# Patient Record
Sex: Male | Born: 1985 | Race: White | Hispanic: No | Marital: Single | Smoking: Current every day smoker
Health system: Southern US, Community
[De-identification: ages and names within clinical notes are randomized; demographics above are authoritative.]

## PROBLEM LIST (undated history)

## (undated) DIAGNOSIS — K4090 Unilateral inguinal hernia, without obstruction or gangrene, not specified as recurrent: Secondary | ICD-10-CM

---

## 2005-02-06 ENCOUNTER — Emergency Department (HOSPITAL_COMMUNITY): Admission: EM | Admit: 2005-02-06 | Discharge: 2005-02-06 | Payer: Self-pay | Admitting: Emergency Medicine

## 2005-06-23 ENCOUNTER — Emergency Department (HOSPITAL_COMMUNITY): Admission: EM | Admit: 2005-06-23 | Discharge: 2005-06-23 | Payer: Self-pay | Admitting: Emergency Medicine

## 2005-10-31 ENCOUNTER — Emergency Department (HOSPITAL_COMMUNITY): Admission: EM | Admit: 2005-10-31 | Discharge: 2005-10-31 | Payer: Self-pay | Admitting: Family Medicine

## 2006-04-10 ENCOUNTER — Emergency Department (HOSPITAL_COMMUNITY): Admission: EM | Admit: 2006-04-10 | Discharge: 2006-04-10 | Payer: Self-pay | Admitting: Emergency Medicine

## 2009-12-10 ENCOUNTER — Emergency Department (HOSPITAL_COMMUNITY)
Admission: EM | Admit: 2009-12-10 | Discharge: 2009-12-10 | Payer: Self-pay | Source: Home / Self Care | Admitting: Emergency Medicine

## 2011-07-22 ENCOUNTER — Encounter (HOSPITAL_COMMUNITY): Payer: Self-pay

## 2011-07-22 ENCOUNTER — Emergency Department (INDEPENDENT_AMBULATORY_CARE_PROVIDER_SITE_OTHER)
Admission: EM | Admit: 2011-07-22 | Discharge: 2011-07-22 | Disposition: A | Discharge status: Home / Self Care | Payer: Self-pay | Source: Home / Self Care | Attending: Family Medicine | Admitting: Family Medicine

## 2011-07-22 DIAGNOSIS — K089 Disorder of teeth and supporting structures, unspecified: Secondary | ICD-10-CM

## 2011-07-22 DIAGNOSIS — K0889 Other specified disorders of teeth and supporting structures: Secondary | ICD-10-CM

## 2011-07-22 MED ORDER — DICLOFENAC POTASSIUM 50 MG PO TABS: 50.0000 mg | ORAL_TABLET | Freq: Three times a day (TID) | ORAL | Status: AC

## 2011-07-22 MED ORDER — AMOXICILLIN 500 MG PO CAPS: 500.0000 mg | ORAL_CAPSULE | Freq: Three times a day (TID) | ORAL | Status: AC

## 2011-07-22 NOTE — Discharge Instructions (Signed)
Call dentist on mon for dental care.

## 2011-07-22 NOTE — ED Notes (Signed)
Pt has lt sided toothache that started this am.

## 2011-07-22 NOTE — ED Provider Notes (Signed)
History     CSN: [id number]  Arrival date & time 07/22/11  2841   First MD Initiated Contact with Patient 07/22/11 0920      Chief Complaint  Patient presents with  . Dental Injury    (Consider location/radiation/quality/duration/timing/severity/associated sxs/prior treatment) Patient is a 26 y.o. male presenting with dental injury. The history is provided by the patient.  Dental Injury This is a new problem. The current episode started 3 to 5 hours ago. The problem has been gradually worsening.    History reviewed. No pertinent past medical history.  History reviewed. No pertinent past surgical history.  History reviewed. No pertinent family history.  History  Substance Use Topics  . Smoking status: Not on file  . Smokeless tobacco: Not on file  . Alcohol Use: Not on file      Review of Systems  Constitutional: Negative.   HENT: Positive for dental problem.   All other systems reviewed and are negative.    Allergies  Review of patient's allergies indicates no known allergies.  Home Medications   Current Outpatient Rx  Name Route Sig Dispense Refill  . ASPIRIN 325 MG PO TABS Oral Take 325 mg by mouth daily.    . AMOXICILLIN 500 MG PO CAPS Oral Take 1 capsule (500 mg total) by mouth 3 (three) times daily. 30 capsule 0  . DICLOFENAC POTASSIUM 50 MG PO TABS Oral Take 1 tablet (50 mg total) by mouth 3 (three) times daily. 15 tablet 0    BP 146/86  Pulse 88  Temp(Src) 99 F (37.2 C) (Oral)  Resp 18  Physical Exam  Nursing note and vitals reviewed. Constitutional: He appears well-developed and well-nourished.  HENT:  Head: Normocephalic.  Right Ear: External ear normal.  Left Ear: External ear normal.  Mouth/Throat: Oropharynx is clear and moist.      ED Course  Procedures (including critical care time)  Labs Reviewed - No data to display No results found.   1. Pain, dental       MDM          Linna Hoff, MD 07/22/11 [phone]

## 2019-09-09 ENCOUNTER — Other Ambulatory Visit (HOSPITAL_COMMUNITY): Payer: Self-pay | Admitting: Urology

## 2019-09-09 DIAGNOSIS — K4090 Unilateral inguinal hernia, without obstruction or gangrene, not specified as recurrent: Secondary | ICD-10-CM

## 2019-09-23 ENCOUNTER — Ambulatory Visit (HOSPITAL_COMMUNITY)
Admission: RE | Admit: 2019-09-23 | Discharge: 2019-09-23 | Disposition: A | Discharge status: Home / Self Care | Payer: Self-pay | Source: Ambulatory Visit | Attending: Urology | Admitting: Urology

## 2019-09-23 ENCOUNTER — Other Ambulatory Visit: Payer: Self-pay

## 2019-09-23 DIAGNOSIS — K4090 Unilateral inguinal hernia, without obstruction or gangrene, not specified as recurrent: Secondary | ICD-10-CM | POA: Insufficient documentation

## 2019-09-30 ENCOUNTER — Telehealth: Payer: Self-pay | Admitting: Family

## 2019-09-30 DIAGNOSIS — R300 Dysuria: Secondary | ICD-10-CM

## 2019-09-30 NOTE — Progress Notes (Signed)
Based on what you shared with me, I feel your condition warrants further evaluation and I recommend that you be seen for a face to face office visit.  Male bladder infections are not very common.  We worry about prostate or kidney conditions.  The standard of care is to examine the abdomen and kidneys, and to do a urine and blood test to make sure that something more serious is not going on.  We recommend that you see a provider today.  If your doctor's office is closed [facility] has the following Urgent Cares:    NOTE: If you entered your credit card information for this eVisit, you will not be charged. You may see a "hold" on your card for the $35 but that hold will drop off and you will not have a charge processed.   If you are having a true medical emergency please call 911.     For an urgent face to face visit, [facility] has four urgent care centers for your convenience:    NEW:  [facility] [phone] [address] [place], [place] [address] .  Monday - Friday 10 am - 6 pm    . [facility]    [phone]                  Get Driving Directions  [address] [place], [place] [address] . 10 am to 8 pm Monday-Friday . 12 pm to 8 pm Saturday-Sunday   . [facility]  [phone]                  Get Driving Directions  [address], [address] [place], [place] [address] . 8 am to 8 pm Monday-Friday . 9 am to 6 pm Saturday . 11 am to 6 pm Sunday   . [facility] [place]  [phone]                  Get Driving Directions   [address] [place], [place] [address] . 8 am to 8 pm Monday-Friday . 8 am to 4 pm Saturday-Sunday    . [facility] [place]                    Get Driving Directions  [phone]  [address] [place], [place] [address]  . Monday-Friday, 12 PM to 6 PM    Your e-visit  answers were reviewed by a board certified advanced clinical practitioner to complete your personal care plan.  Thank you for using e-Visits.  

## 2019-10-15 ENCOUNTER — Ambulatory Visit: Payer: Self-pay | Admitting: General Surgery

## 2019-10-15 NOTE — H&P (Signed)
History of Present Illness Axel Filler MD; 10/15/2019 11:05 AM) The patient is a 34 year old male who presents with an inguinal hernia. Referred by: Dr. Benancio Deeds Chief Complaint: Right inguinal hernia  Patient is a 34 year old male, who comes in with a 4-5 month history Amy longer of a right inguinal hernia. Patient states that may have been there prior to that time however he states became larger last 4-5 months. He states that he has had no issues with constipation, pain, nausea or vomiting. He's had no signs or symptoms of incarceration or strangulation. Patient recently underwent CT scan which I interpreted personally. This reveals a large right inguinal hernia. There is large amount of colon as well as small intestine within the hernia sac.  Patient is had no previous surgery.    Past Surgical History Adela Lank [place], RMA; 10/15/2019 10:49 AM) No pertinent past surgical history   Diagnostic Studies History Adela Lank [place], RMA; 10/15/2019 10:49 AM) Colonoscopy  never  Allergies Adela Lank [place], RMA; 10/15/2019 10:49 AM) No Known Drug Allergies  [10/15/2019]: Allergies Reconciled   Medication History [employer], RMA; 10/15/2019 10:49 AM) Aspirin (325MG  Tablet, Oral as needed) Active. (For Headaches) Medications Reconciled  Social History [place], RMA; 10/15/2019 10:49 AM) Alcohol use  Occasional alcohol use. Caffeine use  Carbonated beverages, Coffee. No drug use  Tobacco use  Current some day smoker.  Family History 12/15/2019 [place], [place]; 10/15/2019 10:49 AM) Family history unknown  First Degree Relatives   Other Problems 12/15/2019 [place], RMA; 10/15/2019 10:49 AM) No pertinent past medical history     Review of Systems 12/15/2019 MD; 10/15/2019 11:03 AM) General Not Present- Appetite Loss, Chills, Fatigue, Fever, Night Sweats, Weight Gain and Weight Loss. Skin Not Present- Change in Wart/Mole, Dryness, Hives, Jaundice,  New Lesions, Non-Healing Wounds, Rash and Ulcer. HEENT Not Present- Earache, Hearing Loss, Hoarseness, Nose Bleed, Oral Ulcers, Ringing in the Ears, Seasonal Allergies, Sinus Pain, Sore Throat, Visual Disturbances, Wears glasses/contact lenses and Yellow Eyes. Respiratory Not Present- Bloody sputum, Chronic Cough, Difficulty Breathing, Snoring and Wheezing. Breast Not Present- Breast Mass, Breast Pain, Nipple Discharge and Skin Changes. Cardiovascular Not Present- Chest Pain, Difficulty Breathing Lying Down, Leg Cramps, Palpitations, Rapid Heart Rate, Shortness of Breath and Swelling of Extremities. Gastrointestinal Present- Abdominal Pain. Not Present- Bloating, Bloody Stool, Change in Bowel Habits, Chronic diarrhea, Constipation, Difficulty Swallowing, Excessive gas, Gets full quickly at meals, Hemorrhoids, Indigestion, Nausea, Rectal Pain and Vomiting. Male Genitourinary Not Present- Blood in Urine, Change in Urinary Stream, Frequency, Impotence, Nocturia, Painful Urination, Urgency and Urine Leakage. Musculoskeletal Not Present- Back Pain, Joint Pain, Joint Stiffness, Muscle Pain, Muscle Weakness and Swelling of Extremities. Neurological Not Present- Decreased Memory, Fainting, Headaches, Numbness, Seizures, Tingling, Tremor, Trouble walking and Weakness. Psychiatric Not Present- Anxiety, Bipolar, Change in Sleep Pattern, Depression, Fearful and Frequent crying. Endocrine Not Present- Cold Intolerance, Excessive Hunger, Hair Changes, Heat Intolerance, Hot flashes and New Diabetes. Hematology Not Present- Blood Thinners, Easy Bruising, Excessive bleeding, Gland problems, HIV and Persistent Infections. All other systems negative  Vitals 12/15/2019 [place] RMA; 10/15/2019 10:50 AM) 10/15/2019 10:49 AM Weight: 209 lb Height: 67in Body Surface Area: 2.06 m Body Mass Index: 32.73 kg/m  Temp.: 51F (Temporal)  Pulse: 119 (Regular)  P.OX: 97% (Room air) BP: 152/78(Sitting, Left Arm,  Standard)       Physical Exam 12/15/2019 MD; 10/15/2019 11:05 AM) The physical exam findings are as follows: Note: Constitutional: No acute distress, conversant, appears stated age  Eyes: Anicteric sclerae, moist conjunctiva,  no lid lag  Neck: No thyromegaly, trachea midline, no cervical lymphadenopathy  Lungs: Clear to auscultation biilaterally, normal respiratory effot  Cardiovascular: regular rate & rhythm, no murmurs, no peripheal edema, pedal pulses 2+  GI: Soft, no masses or hepatosplenomegaly, non-tender to palpation  MSK: Normal gait, no clubbing cyanosis, edema  Skin: No rashes, palpation reveals normal skin turgor  Psychiatric: Appropriate judgment and insight, oriented to person, place, and time  Abdomen Inspection Hernias - Inguinal hernia - Right - Incarcerated - Right and Reducible - Right. Note: extremely large.    Assessment & Plan Axel Filler MD; 10/15/2019 11:06 AM) RIGHT INGUINAL HERNIA (K40.90) Impression: Patient is a 34 year old male with a large right inguinoscrotal hernia.  1. The patient will like to proceed to the operating room for open right inguinal hernia repair with mesh.  2. I discussed with the patient the signs and symptoms of incarceration and strangulation and the need to proceed to the ER should they occur.  3. I discussed with the patient the risks and benefits of the procedure to include but not limited to: Infection, bleeding, damage to surrounding structures, possible need for further surgery, possible nerve pain, and possible recurrence. The patient was understanding and wishes to proceed.  I reviewed the patient's external notes from the referring physicians as well as consulting physician team. Each of the radiologic studies and lab studies were independently reviewed and interpreted. I discussed the results of the above studies and how they relate to the patient's surgical problems.

## 2019-11-26 NOTE — Progress Notes (Signed)
Your procedure is scheduled on Wednesday, September 22nd.  Report to [facility] Main Entrance "A" at 10:00 A.M., and check in at the Admitting office.  Call this number if you have problems the morning of surgery:  [phone]  Call [phone] if you have any questions prior to your surgery date Monday-Friday 8am-4pm   Remember:  Do not eat after midnight the night before your surgery  You may drink clear liquids until 9:00 A.M. the morning of your surgery.   Clear liquids allowed are: Water, Non-Citrus Juices (without pulp), Carbonated Beverages, Clear Tea, Black Coffee Only, and Gatorade   Please complete your PRE-SURGERY ENSURE that was provided to you by 9:00 A.M. the morning of surgery.  Please, if able, drink it in one setting. DO NOT SIP.   Take these medicines the morning of surgery with A SIP OF WATER: NONE   As of today, STOP taking any Aspirin (unless otherwise instructed by your surgeon) Aleve, Naproxen, Ibuprofen, Motrin, Advil, Goody's, BC's, all herbal medications, fish oil, and all vitamins.                     Do not wear jewelry, make up, or nail polish            Do not wear lotions, powders, perfumes, or deodorant.            Do not shave 48 hours prior to surgery.             Do not bring valuables to the hospital.            [facility] is not responsible for any belongings or valuables.  Do NOT Smoke (Tobacco/Vaping) or drink Alcohol 24 hours prior to your procedure If you use a CPAP at night, you may bring all equipment for your overnight stay.   Contacts, glasses, dentures or bridgework may not be worn into surgery.      For patients admitted to the hospital, discharge time will be determined by your treatment team.   Patients discharged the day of surgery will not be allowed to drive home, and someone needs to stay with them for 24 hours.  Special instructions:   [facility]- Preparing For Surgery  Before surgery, you can play an important role.  Because skin is not sterile, your skin needs to be as free of germs as possible. You can reduce the number of germs on your skin by washing with CHG (chlorahexidine gluconate) Soap before surgery.  CHG is an antiseptic cleaner which kills germs and bonds with the skin to continue killing germs even after washing.    Oral Hygiene is also important to reduce your risk of infection.  Remember - BRUSH YOUR TEETH THE MORNING OF SURGERY WITH YOUR REGULAR TOOTHPASTE  Please do not use if you have an allergy to CHG or antibacterial soaps. If your skin becomes reddened/irritated stop using the CHG.  Do not shave (including legs and underarms) for at least 48 hours prior to first CHG shower. It is OK to shave your face.  Please follow these instructions carefully.   1. Shower the NIGHT BEFORE SURGERY and the MORNING OF SURGERY with CHG Soap.   2. If you chose to wash your hair, wash your hair first as usual with your normal shampoo.  3. After you shampoo, rinse your hair and body thoroughly to remove the shampoo.  4. Use CHG as you would any other liquid soap. You can apply CHG directly  to the skin and wash gently with a scrungie or a clean washcloth.   5. Apply the CHG Soap to your body ONLY FROM THE NECK DOWN.  Do not use on open wounds or open sores. Avoid contact with your eyes, ears, mouth and genitals (private parts). Wash Face and genitals (private parts)  with your normal soap.   6. Wash thoroughly, paying special attention to the area where your surgery will be performed.  7. Thoroughly rinse your body with warm water from the neck down.  8. DO NOT shower/wash with your normal soap after using and rinsing off the CHG Soap.  9. Pat yourself dry with a CLEAN TOWEL.  10. Wear CLEAN PAJAMAS to bed the night before surgery  11. Place CLEAN SHEETS on your bed the night of your first shower and DO NOT SLEEP WITH PETS.  Day of Surgery: Wear Clean/Comfortable clothing the morning of surgery Do  not apply any deodorants/lotions.   Remember to brush your teeth WITH YOUR REGULAR TOOTHPASTE.   Please read over the following fact sheets that you were given.

## 2019-11-27 ENCOUNTER — Encounter (HOSPITAL_COMMUNITY): Payer: Self-pay

## 2019-11-27 ENCOUNTER — Encounter (HOSPITAL_COMMUNITY)
Admission: RE | Admit: 2019-11-27 | Discharge: 2019-11-27 | Disposition: A | Discharge status: Home / Self Care | Payer: Self-pay | Source: Ambulatory Visit | Attending: General Surgery | Admitting: General Surgery

## 2019-11-27 ENCOUNTER — Other Ambulatory Visit: Payer: Self-pay

## 2019-11-27 DIAGNOSIS — Z01812 Encounter for preprocedural laboratory examination: Secondary | ICD-10-CM | POA: Insufficient documentation

## 2019-11-27 HISTORY — DX: Unilateral inguinal hernia, without obstruction or gangrene, not specified as recurrent: K40.90

## 2019-11-27 LAB — BASIC METABOLIC PANEL
Anion gap: 9 (ref 5–15)
BUN: <5 mg/dL — ABNORMAL LOW (ref 6–20)
CO2: 25 mmol/L (ref 22–32)
Calcium: 9 mg/dL (ref 8.9–10.3)
Chloride: 104 mmol/L (ref 98–111)
Creatinine, Ser: 0.96 mg/dL (ref 0.61–1.24)
GFR calc Af Amer: >60 mL/min (ref >60)
GFR calc non Af Amer: >60 mL/min (ref >60)
Glucose, Bld: 144 mg/dL — ABNORMAL HIGH (ref 70–99)
Potassium: 3.2 mmol/L — ABNORMAL LOW (ref 3.5–5.1)
Sodium: 138 mmol/L (ref 135–145)

## 2019-11-27 LAB — CBC
HCT: 45.3 % (ref 39.0–52.0)
Hemoglobin: 15.3 g/dL (ref 13.0–17.0)
MCH: 30.4 pg (ref 26.0–34.0)
MCHC: 33.8 g/dL (ref 30.0–36.0)
MCV: 90.1 fL (ref 80.0–100.0)
Platelets: 275 10*3/uL (ref 150–400)
RBC: 5.03 MIL/uL (ref 4.22–5.81)
RDW: 12.1 % (ref 11.5–15.5)
WBC: 8.3 10*3/uL (ref 4.0–10.5)
nRBC: 0 % (ref 0.0–0.2)

## 2019-11-27 NOTE — Progress Notes (Signed)
PCP - denies Cardiologist - denies  PPM/ICD - denies  Chest x-ray - N/A EKG - N/A Stress Test - denies ECHO - denies Cardiac Cath - denies  Sleep Study - denies CPAP - N/A  DM: denies  Blood Thinner Instructions: N/A Aspirin Instructions: N/A  ERAS Protcol - Yes PRE-SURGERY Ensure or G2- Ensure given  COVID TEST- Scheduled for 11/28/2019. Patient verbalized understanding of self-quarantine instructions, appointment time and place.  Anesthesia review: No  Patient denies shortness of breath, fever, cough and chest pain at PAT appointment  All instructions explained to the patient, with a verbal understanding of the material. Patient agrees to go over the instructions while at home for a better understanding. Patient also instructed to self quarantine after being tested for COVID-19. The opportunity to ask questions was provided.

## 2019-11-28 ENCOUNTER — Other Ambulatory Visit (HOSPITAL_COMMUNITY)
Admission: RE | Admit: 2019-11-28 | Discharge: 2019-11-28 | Disposition: A | Discharge status: Home / Self Care | Payer: HRSA Program | Source: Ambulatory Visit | Attending: General Surgery | Admitting: General Surgery

## 2019-11-28 DIAGNOSIS — Z01812 Encounter for preprocedural laboratory examination: Secondary | ICD-10-CM | POA: Insufficient documentation

## 2019-11-28 DIAGNOSIS — Z20822 Contact with and (suspected) exposure to covid-19: Secondary | ICD-10-CM | POA: Diagnosis not present

## 2019-11-28 LAB — SARS CORONAVIRUS 2 (TAT 6-24 HRS): SARS Coronavirus 2: NEGATIVE

## 2019-12-01 NOTE — H&P (Signed)
History of Present Illness (Bryan Moring MD; 10/15/2019 11:05 AM) The patient is a 34 year old male who presents with an inguinal hernia. Referred by: Dr. Newsome Chief Complaint: Right inguinal hernia  Patient is a 34-year-old male, who comes in with a 4-5 month history Amy longer of a right inguinal hernia. Patient states that may have been there prior to that time however he states became larger last 4-5 months. He states that he has had no issues with constipation, pain, nausea or vomiting. He's had no signs or symptoms of incarceration or strangulation. Patient recently underwent CT scan which I interpreted personally. This reveals a large right inguinal hernia. There is large amount of colon as well as small intestine within the hernia sac.  Patient is had no previous surgery.    Past Surgical History (Bryan Walton, RMA; 10/15/2019 10:49 AM) No pertinent past surgical history   Diagnostic Studies History (Bryan Walton, RMA; 10/15/2019 10:49 AM) Colonoscopy  never  Allergies (Bryan Walton, RMA; 10/15/2019 10:49 AM) No Known Drug Allergies  [10/15/2019]: Allergies Reconciled   Medication History (Bryan Walton, RMA; 10/15/2019 10:49 AM) Aspirin (325MG Tablet, Oral as needed) Active. (For Headaches) Medications Reconciled  Social History (Bryan Walton, RMA; 10/15/2019 10:49 AM) Alcohol use  Occasional alcohol use. Caffeine use  Carbonated beverages, Coffee. No drug use  Tobacco use  Current some day smoker.  Family History (Bryan Walton, RMA; 10/15/2019 10:49 AM) Family history unknown  First Degree Relatives   Other Problems (Bryan Walton, RMA; 10/15/2019 10:49 AM) No pertinent past medical history     Review of Systems (Bryan Whitehorn MD; 10/15/2019 11:03 AM) General Not Present- Appetite Loss, Chills, Fatigue, Fever, Night Sweats, Weight Gain and Weight Loss. Skin Not Present- Change in Wart/Mole, Dryness, Hives, Jaundice,  New Lesions, Non-Healing Wounds, Rash and Ulcer. HEENT Not Present- Earache, Hearing Loss, Hoarseness, Nose Bleed, Oral Ulcers, Ringing in the Ears, Seasonal Allergies, Sinus Pain, Sore Throat, Visual Disturbances, Wears glasses/contact lenses and Yellow Eyes. Respiratory Not Present- Bloody sputum, Chronic Cough, Difficulty Breathing, Snoring and Wheezing. Breast Not Present- Breast Mass, Breast Pain, Nipple Discharge and Skin Changes. Cardiovascular Not Present- Chest Pain, Difficulty Breathing Lying Down, Leg Cramps, Palpitations, Rapid Heart Rate, Shortness of Breath and Swelling of Extremities. Gastrointestinal Present- Abdominal Pain. Not Present- Bloating, Bloody Stool, Change in Bowel Habits, Chronic diarrhea, Constipation, Difficulty Swallowing, Excessive gas, Gets full quickly at meals, Hemorrhoids, Indigestion, Nausea, Rectal Pain and Vomiting. Male Genitourinary Not Present- Blood in Urine, Change in Urinary Stream, Frequency, Impotence, Nocturia, Painful Urination, Urgency and Urine Leakage. Musculoskeletal Not Present- Back Pain, Joint Pain, Joint Stiffness, Muscle Pain, Muscle Weakness and Swelling of Extremities. Neurological Not Present- Decreased Memory, Fainting, Headaches, Numbness, Seizures, Tingling, Tremor, Trouble walking and Weakness. Psychiatric Not Present- Anxiety, Bipolar, Change in Sleep Pattern, Depression, Fearful and Frequent crying. Endocrine Not Present- Cold Intolerance, Excessive Hunger, Hair Changes, Heat Intolerance, Hot flashes and New Diabetes. Hematology Not Present- Blood Thinners, Easy Bruising, Excessive bleeding, Gland problems, HIV and Persistent Infections. All other systems negative  Vitals (Bryan Walton RMA; 10/15/2019 10:50 AM) 10/15/2019 10:49 AM Weight: 209 lb Height: 67in Body Surface Area: 2.06 m Body Mass Index: 32.73 kg/m  Temp.: 97F (Temporal)  Pulse: 119 (Regular)  P.OX: 97% (Room air) BP: 152/78(Sitting, Left Arm,  Standard)       Physical Exam (Roniel Halloran MD; 10/15/2019 11:05 AM) The physical exam findings are as follows: Note: Constitutional: No acute distress, conversant, appears stated age  Eyes: Anicteric sclerae, moist conjunctiva,   no lid lag  Neck: No thyromegaly, trachea midline, no cervical lymphadenopathy  Lungs: Clear to auscultation biilaterally, normal respiratory effot  Cardiovascular: regular rate & rhythm, no murmurs, no peripheal edema, pedal pulses 2+  GI: Soft, no masses or hepatosplenomegaly, non-tender to palpation  MSK: Normal gait, no clubbing cyanosis, edema  Skin: No rashes, palpation reveals normal skin turgor  Psychiatric: Appropriate judgment and insight, oriented to person, place, and time  Abdomen Inspection Hernias - Inguinal hernia - Right - Incarcerated - Right and Reducible - Right. Note: extremely large.    Assessment & Plan (Lakrisha Iseman MD; 10/15/2019 11:06 AM) RIGHT INGUINAL HERNIA (K40.90) Impression: Patient is a 34-year-old male with a large right inguinoscrotal hernia.  1. The patient will like to proceed to the operating room for open right inguinal hernia repair with mesh.  2. I discussed with the patient the signs and symptoms of incarceration and strangulation and the need to proceed to the ER should they occur.  3. I discussed with the patient the risks and benefits of the procedure to include but not limited to: Infection, bleeding, damage to surrounding structures, possible need for further surgery, possible nerve pain, and possible recurrence. The patient was understanding and wishes to proceed.  I reviewed the patient's external notes from the referring physicians as well as consulting physician team. Each of the radiologic studies and lab studies were independently reviewed and interpreted. I discussed the results of the above studies and how they relate to the patient's surgical problems. 

## 2019-12-02 ENCOUNTER — Encounter (HOSPITAL_COMMUNITY)
Admission: RE | Disposition: A | Discharge status: Home / Self Care | Payer: Self-pay | Source: Home / Self Care | Attending: General Surgery

## 2019-12-02 ENCOUNTER — Ambulatory Visit (HOSPITAL_COMMUNITY): Payer: Self-pay | Admitting: Certified Registered Nurse Anesthetist

## 2019-12-02 ENCOUNTER — Ambulatory Visit (HOSPITAL_COMMUNITY)
Admission: RE | Admit: 2019-12-02 | Discharge: 2019-12-02 | Disposition: A | Discharge status: Home / Self Care | Payer: Self-pay | Attending: General Surgery | Admitting: General Surgery

## 2019-12-02 ENCOUNTER — Encounter (HOSPITAL_COMMUNITY): Payer: Self-pay | Admitting: General Surgery

## 2019-12-02 ENCOUNTER — Other Ambulatory Visit: Payer: Self-pay

## 2019-12-02 DIAGNOSIS — F17200 Nicotine dependence, unspecified, uncomplicated: Secondary | ICD-10-CM | POA: Insufficient documentation

## 2019-12-02 DIAGNOSIS — K4090 Unilateral inguinal hernia, without obstruction or gangrene, not specified as recurrent: Secondary | ICD-10-CM | POA: Insufficient documentation

## 2019-12-02 HISTORY — PX: INGUINAL HERNIA REPAIR: SHX194

## 2019-12-02 SURGERY — REPAIR, HERNIA, INGUINAL, ADULT
Anesthesia: General | Site: Groin | Laterality: Right

## 2019-12-02 MED ORDER — BUPIVACAINE HCL 0.25 % IJ SOLN
INTRAMUSCULAR | Status: DC | PRN
  Administered 2019-12-02: 20 mL

## 2019-12-02 MED ORDER — PHENYLEPHRINE HCL-NACL 10-0.9 MG/250ML-% IV SOLN
INTRAVENOUS | Status: DC | PRN
  Administered 2019-12-02: 25 ug/min via INTRAVENOUS

## 2019-12-02 MED ORDER — ORAL CARE MOUTH RINSE: 15.0000 mL | Freq: Once | OROMUCOSAL | Status: AC

## 2019-12-02 MED ORDER — MIDAZOLAM HCL 2 MG/2ML IJ SOLN: 2.0000 mg | Freq: Once | INTRAMUSCULAR | Status: AC

## 2019-12-02 MED ORDER — 0.9 % SODIUM CHLORIDE (POUR BTL) OPTIME
TOPICAL | Status: DC | PRN
  Administered 2019-12-02: 1000 mL

## 2019-12-02 MED ORDER — MIDAZOLAM HCL 2 MG/2ML IJ SOLN
INTRAMUSCULAR | Status: AC
  Filled 2019-12-02: qty 2

## 2019-12-02 MED ORDER — FENTANYL CITRATE (PF) 100 MCG/2ML IJ SOLN: 100.0000 ug | Freq: Once | INTRAMUSCULAR | Status: AC

## 2019-12-02 MED ORDER — FENTANYL CITRATE (PF) 100 MCG/2ML IJ SOLN
INTRAMUSCULAR | Status: AC
  Administered 2019-12-02: 100 ug via INTRAVENOUS
  Filled 2019-12-02: qty 2

## 2019-12-02 MED ORDER — CHLORHEXIDINE GLUCONATE CLOTH 2 % EX PADS: 6.0000 | MEDICATED_PAD | Freq: Once | CUTANEOUS | Status: DC

## 2019-12-02 MED ORDER — ACETAMINOPHEN 500 MG PO TABS: 1000.0000 mg | ORAL_TABLET | ORAL | Status: AC

## 2019-12-02 MED ORDER — B & B A-2 ATHLETIC SUPPORTER MISC: 1.0000 [IU] | Freq: Every day | 0 refills | Status: AC

## 2019-12-02 MED ORDER — ONDANSETRON HCL 4 MG/2ML IJ SOLN
INTRAMUSCULAR | Status: DC | PRN
  Administered 2019-12-02: 4 mg via INTRAVENOUS

## 2019-12-02 MED ORDER — PROPOFOL 10 MG/ML IV BOLUS
INTRAVENOUS | Status: DC | PRN
  Administered 2019-12-02: 150 mg via INTRAVENOUS
  Administered 2019-12-02: 50 mg via INTRAVENOUS

## 2019-12-02 MED ORDER — GABAPENTIN 300 MG PO CAPS: 300.0000 mg | ORAL_CAPSULE | ORAL | Status: AC

## 2019-12-02 MED ORDER — MIDAZOLAM HCL 2 MG/2ML IJ SOLN
INTRAMUSCULAR | Status: AC
  Administered 2019-12-02: 2 mg via INTRAVENOUS
  Filled 2019-12-02: qty 2

## 2019-12-02 MED ORDER — GABAPENTIN 300 MG PO CAPS
ORAL_CAPSULE | ORAL | Status: AC
  Administered 2019-12-02: 300 mg via ORAL
  Filled 2019-12-02: qty 1

## 2019-12-02 MED ORDER — ROCURONIUM BROMIDE 10 MG/ML (PF) SYRINGE
PREFILLED_SYRINGE | INTRAVENOUS | Status: AC
  Filled 2019-12-02: qty 10

## 2019-12-02 MED ORDER — SUGAMMADEX SODIUM 200 MG/2ML IV SOLN
INTRAVENOUS | Status: DC | PRN
  Administered 2019-12-02: 200 mg via INTRAVENOUS

## 2019-12-02 MED ORDER — OXYCODONE-ACETAMINOPHEN 5-325 MG PO TABS: 1.0000 | ORAL_TABLET | ORAL | 0 refills | Status: AC | PRN

## 2019-12-02 MED ORDER — DEXAMETHASONE SODIUM PHOSPHATE 10 MG/ML IJ SOLN
INTRAMUSCULAR | Status: AC
  Filled 2019-12-02: qty 1

## 2019-12-02 MED ORDER — BUPIVACAINE HCL (PF) 0.25 % IJ SOLN
INTRAMUSCULAR | Status: AC
  Filled 2019-12-02: qty 30

## 2019-12-02 MED ORDER — LACTATED RINGERS IV SOLN: INTRAVENOUS | Status: DC

## 2019-12-02 MED ORDER — CHLORHEXIDINE GLUCONATE 0.12 % MT SOLN
15.0000 mL | Freq: Once | OROMUCOSAL | Status: AC
  Administered 2019-12-02: 15 mL via OROMUCOSAL

## 2019-12-02 MED ORDER — LIDOCAINE 2% (20 MG/ML) 5 ML SYRINGE
INTRAMUSCULAR | Status: AC
  Filled 2019-12-02: qty 5

## 2019-12-02 MED ORDER — BUPIVACAINE LIPOSOME 1.3 % IJ SUSP
INTRAMUSCULAR | Status: DC | PRN
  Administered 2019-12-02: 10 mL

## 2019-12-02 MED ORDER — ONDANSETRON HCL 4 MG/2ML IJ SOLN
INTRAMUSCULAR | Status: AC
  Filled 2019-12-02: qty 2

## 2019-12-02 MED ORDER — ACETAMINOPHEN 500 MG PO TABS
ORAL_TABLET | ORAL | Status: AC
  Administered 2019-12-02: 1000 mg via ORAL
  Filled 2019-12-02: qty 2

## 2019-12-02 MED ORDER — FENTANYL CITRATE (PF) 250 MCG/5ML IJ SOLN
INTRAMUSCULAR | Status: DC | PRN
Start: 2019-12-02 — End: 2019-12-02
  Administered 2019-12-02 (×2): 25 ug via INTRAVENOUS
  Administered 2019-12-02 (×2): 50 ug via INTRAVENOUS
  Administered 2019-12-02: 100 ug via INTRAVENOUS

## 2019-12-02 MED ORDER — CEFAZOLIN SODIUM-DEXTROSE 2-4 GM/100ML-% IV SOLN
INTRAVENOUS | Status: AC
  Filled 2019-12-02: qty 100

## 2019-12-02 MED ORDER — BUPIVACAINE HCL (PF) 0.5 % IJ SOLN
INTRAMUSCULAR | Status: DC | PRN
  Administered 2019-12-02: 15 mL

## 2019-12-02 MED ORDER — DEXAMETHASONE SODIUM PHOSPHATE 10 MG/ML IJ SOLN
INTRAMUSCULAR | Status: DC | PRN
  Administered 2019-12-02: 10 mg via INTRAVENOUS

## 2019-12-02 MED ORDER — FENTANYL CITRATE (PF) 250 MCG/5ML IJ SOLN
INTRAMUSCULAR | Status: AC
  Filled 2019-12-02: qty 5

## 2019-12-02 MED ORDER — PROPOFOL 10 MG/ML IV BOLUS
INTRAVENOUS | Status: AC
  Filled 2019-12-02: qty 20

## 2019-12-02 MED ORDER — ROCURONIUM BROMIDE 10 MG/ML (PF) SYRINGE
PREFILLED_SYRINGE | INTRAVENOUS | Status: DC | PRN
  Administered 2019-12-02: 100 mg via INTRAVENOUS

## 2019-12-02 MED ORDER — ENSURE PRE-SURGERY PO LIQD: 296.0000 mL | Freq: Once | ORAL | Status: DC

## 2019-12-02 MED ORDER — CEFAZOLIN SODIUM-DEXTROSE 2-4 GM/100ML-% IV SOLN
2.0000 g | INTRAVENOUS | Status: AC
  Administered 2019-12-02: 2 g via INTRAVENOUS

## 2019-12-02 SURGICAL SUPPLY — 49 items
ADH SKN CLS APL DERMABOND .7 (GAUZE/BANDAGES/DRESSINGS) ×1
APL PRP STRL LF DISP 70% ISPRP (MISCELLANEOUS) ×1
BLADE CLIPPER SURG (BLADE) ×3 IMPLANT
CANISTER SUCT 3000ML PPV (MISCELLANEOUS) ×3 IMPLANT
CHLORAPREP W/TINT 26 (MISCELLANEOUS) ×3 IMPLANT
CNTNR URN SCR LID CUP LEK RST (MISCELLANEOUS) ×1 IMPLANT
CONT SPEC 4OZ STRL OR WHT (MISCELLANEOUS) ×3
COVER SURGICAL LIGHT HANDLE (MISCELLANEOUS) ×3 IMPLANT
COVER WAND RF STERILE (DRAPES) IMPLANT
DERMABOND ADVANCED (GAUZE/BANDAGES/DRESSINGS) ×2
DERMABOND ADVANCED .7 DNX12 (GAUZE/BANDAGES/DRESSINGS) ×1 IMPLANT
DRAIN PENROSE 1/2X12 LTX STRL (WOUND CARE) ×3 IMPLANT
DRAPE LAPAROTOMY TRNSV 102X78 (DRAPES) ×3 IMPLANT
ELECT REM PT RETURN 9FT ADLT (ELECTROSURGICAL) ×3
ELECTRODE REM PT RTRN 9FT ADLT (ELECTROSURGICAL) ×1 IMPLANT
GAUZE 4X4 16PLY RFD (DISPOSABLE) ×3 IMPLANT
GLOVE BIO SURGEON STRL SZ7.5 (GLOVE) ×3 IMPLANT
GLOVE BIOGEL PI IND STRL 8 (GLOVE) ×1 IMPLANT
GLOVE BIOGEL PI INDICATOR 8 (GLOVE) ×2
GOWN STRL REUS W/ TWL LRG LVL3 (GOWN DISPOSABLE) ×2 IMPLANT
GOWN STRL REUS W/ TWL XL LVL3 (GOWN DISPOSABLE) ×1 IMPLANT
GOWN STRL REUS W/TWL LRG LVL3 (GOWN DISPOSABLE) ×6
GOWN STRL REUS W/TWL XL LVL3 (GOWN DISPOSABLE) ×3
KIT BASIN OR (CUSTOM PROCEDURE TRAY) ×3 IMPLANT
KIT TURNOVER KIT B (KITS) ×3 IMPLANT
MESH PARIETEX PROGRIP RIGHT (Mesh General) ×3 IMPLANT
NEEDLE HYPO 25GX1X1/2 BEV (NEEDLE) ×3 IMPLANT
NS IRRIG 1000ML POUR BTL (IV SOLUTION) ×3 IMPLANT
PACK GENERAL/GYN (CUSTOM PROCEDURE TRAY) ×3 IMPLANT
PAD ARMBOARD 7.5X6 YLW CONV (MISCELLANEOUS) ×3 IMPLANT
PENCIL SMOKE EVACUATOR (MISCELLANEOUS) ×3 IMPLANT
SPONGE INTESTINAL PEANUT (DISPOSABLE) ×3 IMPLANT
SUT ETHILON 3 0 PS 1 (SUTURE) ×3 IMPLANT
SUT MNCRL AB 4-0 PS2 18 (SUTURE) ×3 IMPLANT
SUT PROLENE 2 0 SH DA (SUTURE) ×3 IMPLANT
SUT SILK 0 TIES 10X30 (SUTURE) ×3 IMPLANT
SUT VIC AB 2-0 CT1 36 (SUTURE) ×3 IMPLANT
SUT VIC AB 2-0 SH 27 (SUTURE) ×3
SUT VIC AB 2-0 SH 27X BRD (SUTURE) ×1 IMPLANT
SUT VIC AB 3-0 SH 27 (SUTURE) ×6
SUT VIC AB 3-0 SH 27X BRD (SUTURE) ×1 IMPLANT
SUT VIC AB 3-0 SH 27XBRD (SUTURE) ×1 IMPLANT
SUT VICRYL AB 2 0 TIES (SUTURE) ×3 IMPLANT
SYR CONTROL 10ML LL (SYRINGE) ×3 IMPLANT
SYRINGE TOOMEY DISP (SYRINGE) ×3 IMPLANT
TOWEL GREEN STERILE (TOWEL DISPOSABLE) ×3 IMPLANT
TOWEL GREEN STERILE FF (TOWEL DISPOSABLE) ×3 IMPLANT
TRAY FOL W/BAG SLVR 16FR STRL (SET/KITS/TRAYS/PACK) ×1 IMPLANT
TRAY FOLEY W/BAG SLVR 16FR LF (SET/KITS/TRAYS/PACK) ×3

## 2019-12-02 NOTE — Transfer of Care (Signed)
Immediate Anesthesia Transfer of Care Note  Patient: Bryan Walton  Procedure(s) Performed: OPEN RIGHT INGUINAL HERNIA REPAIR WITH MESH (Right Groin)  Patient Location: PACU  Anesthesia Type:GA combined with regional for post-op pain  Level of Consciousness: awake, alert  and oriented  Airway & Oxygen Therapy: Patient Spontanous Breathing  Post-op Assessment: Report given to RN, Post -op Vital signs reviewed and stable and Patient moving all extremities X 4  Post vital signs: Reviewed and stable  Last Vitals:  Vitals Value Taken Time  BP 118/83 12/02/19 1219  Temp    Pulse 95 12/02/19 1220  Resp 21 12/02/19 1220  SpO2 99 % 12/02/19 1220  Vitals shown include unvalidated device data.  Last Pain:  Vitals:   12/02/19 0946  TempSrc: Oral  PainSc:       Patients Stated Pain Goal: 3 (12/02/19 0926)  Complications: No complications documented.

## 2019-12-02 NOTE — Anesthesia Procedure Notes (Signed)
Procedure Name: Intubation Date/Time: 12/02/2019 10:24 AM Performed by: Kyung Rudd, CRNA Pre-anesthesia Checklist: Patient identified, Emergency Drugs available, Suction available and Patient being monitored Patient Re-evaluated:Patient Re-evaluated prior to induction Oxygen Delivery Method: Circle System Utilized Preoxygenation: Pre-oxygenation with 100% oxygen Induction Type: IV induction Ventilation: Oral airway inserted - appropriate to patient size Laryngoscope Size: Mac and 4 Grade View: Grade I Tube type: Oral Number of attempts: 1 Airway Equipment and Method: Stylet and Oral airway Placement Confirmation: ETT inserted through vocal cords under direct vision,  positive ETCO2 and breath sounds checked- equal and bilateral Secured at: 22 cm Tube secured with: Tape Dental Injury: Teeth and Oropharynx as per pre-operative assessment  Comments: Performed by Heide Scales, SRNA

## 2019-12-02 NOTE — Op Note (Signed)
12/02/2019  11:52 AM  PATIENT:  Bryan Walton  34 y.o. male  PRE-OPERATIVE DIAGNOSIS:  RIGHT INGUINAL HERNIA  POST-OPERATIVE DIAGNOSIS:  RIGHT INGUINAL HERNIA  PROCEDURE:  Procedure(s): OPEN RIGHT INGUINAL HERNIA REPAIR WITH MESH (Right)  SURGEON:  Surgeon(s) and Role:    Axel Filler, MD - Primary    Iran Sizer, Puja, PA-C - Assisting-Who was essential at helping with retraction and dissection of the large inguinoscrotal hernia.   ANESTHESIA:   local and general  EBL:  minimal   BLOOD ADMINISTERED:none  DRAINS: none   LOCAL MEDICATIONS USED:  BUPIVICAINE   SPECIMEN:  Source of Specimen:  Hernia sac  DISPOSITION OF SPECIMEN:  PATHOLOGY  COUNTS:  YES  TOURNIQUET:  * No tourniquets in log *  DICTATION: .Dragon Dictation  Details of the procedure: The patient was taken back to the operating room. The patient was placed in supine position with bilateral SCDs in place. The patient was prepped and draped in the usual sterile fashion.  After appropriate anitbiotics were confirmed, a time-out was confirmed and all facts were verified.   A 8 cm incision was made just 1 cm superior to the inguinal ligament. Bovie cautery was used to maintain hemostasis dissection is carried down to the external oblique.  A standard incision was made laterally, and the external oblique was bluntly dissected away from the surrounding tissue with Metzenbaum scissors. The external oblique was elevated in the spermatic cord and the hernia were bluntly dissected away from the surrounding tissue.  The ilioinguinal nerve was identified and ligated with an 2-0 dyed vicryl.   The hernia was very large and went into the scrotum.  This was circumferentially dissected away from surrounding tissue.  The cremasterics were bluntly dissected away.  At this time I was able to deliver the hernia sac into the incision site.  At this time I proceeded to dissect away the spermatic cord and testicular vessels away from the  hernia sac.  These were intimately attached.  Once these were dissected away the testicle was placed back into the scrotum.  The vas deferens was identified and protected at all portions of the case.  At this time the hernia sac was entered anteriorly.  The hernia sac was opened in the large amount of omentum and right colon, cecum was splayed open.  At this time I proceeded to replace the omentum and right colon, proximal transverse colon back to the abdominal cavity.  I did extend the internal ring medially.   The hernia sac was highly ligated using 0 Vicryl.  The specimen was sent to pathology. This retracted into the abdomen in the usual fashion.  At this time a right-sided Progrip mesh was then anchored to the pubic tubercle with a 2-0 Prolene.  It was anchored to the shelving edge of the external oblique x 1 and the conjoint tendon cephalad x 1.  The wrap around of the mesh was sutured to the conjoint tendon as well.  The new internal ring did not strangulate the spermatic cord.   The tail was then tucked under the external oblique. At this time the area was irrigated out with sterile saline.    The external oblique was reapproximated using a 2-0 Vicryl in a running fashion. Scarpa's fascia was then reapproximated using a 3-0 Vicryl running fashion. The skin was then reapproximated with 4 Monocryl in a subcuticular fashion. The skin was then dressed with Dermabond.  The patient was taken to the recovery room in  stable condition.      PLAN OF CARE: Discharge to home after PACU  PATIENT DISPOSITION:  PACU - hemodynamically stable.   Delay start of Pharmacological VTE agent (>24hrs) due to surgical blood loss or risk of bleeding: not applicable

## 2019-12-02 NOTE — Anesthesia Preprocedure Evaluation (Signed)
Anesthesia Evaluation  Patient identified by MRN, date of birth, ID band Patient awake    Reviewed: Allergy & Precautions, NPO status , Patient's Chart, lab work & pertinent test results  Airway Mallampati: II  TM Distance: >3 FB Neck ROM: Full    Dental no notable dental hx. (+) Dental Advisory Given   Pulmonary neg pulmonary ROS, Current Smoker and Patient abstained from smoking.,    Pulmonary exam normal        Cardiovascular negative cardio ROS Normal cardiovascular exam     Neuro/Psych negative neurological ROS  negative psych ROS   GI/Hepatic negative GI ROS, Neg liver ROS,   Endo/Other  negative endocrine ROS  Renal/GU negative Renal ROS  negative genitourinary   Musculoskeletal negative musculoskeletal ROS (+)   Abdominal   Peds negative pediatric ROS (+)  Hematology negative hematology ROS (+)   Anesthesia Other Findings   Reproductive/Obstetrics negative OB ROS                             Anesthesia Physical Anesthesia Plan  ASA: II  Anesthesia Plan: General   Post-op Pain Management:  Regional for Post-op pain   Induction: Intravenous  PONV Risk Score and Plan: 3 and Ondansetron, Dexamethasone and Midazolam  Airway Management Planned: Oral ETT  Additional Equipment:   Intra-op Plan:   Post-operative Plan: Extubation in OR  Informed Consent: I have reviewed the patients History and Physical, chart, labs and discussed the procedure including the risks, benefits and alternatives for the proposed anesthesia with the patient or authorized representative who has indicated his/her understanding and acceptance.     Dental advisory given  Plan Discussed with: CRNA, Surgeon and Anesthesiologist  Anesthesia Plan Comments:         Anesthesia Quick Evaluation

## 2019-12-02 NOTE — Progress Notes (Signed)
D/C URINARY CATHETER POST-OPERATIVELY BEFORE THE PATIENT LEFT THE ROOM

## 2019-12-02 NOTE — Anesthesia Procedure Notes (Signed)
Anesthesia Regional Block: TAP block   Pre-Anesthetic Checklist: ,, timeout performed, Correct Patient, Correct Site, Correct Laterality, Correct Procedure, Correct Position, site marked, Risks and benefits discussed,  Surgical consent,  Pre-op evaluation,  At surgeon's request and post-op pain management  Laterality: Right  Prep: chloraprep       Needles:  Injection technique: Single-shot  Needle Type: Echogenic Stimulator Needle     Needle Length: 10cm  Needle Gauge: 21     Additional Needles:   Narrative:  Start time: 12/02/2019 9:48 AM End time: 12/02/2019 9:58 AM Injection made incrementally with aspirations every 5 mL.  Performed by: Personally

## 2019-12-02 NOTE — Anesthesia Postprocedure Evaluation (Signed)
Anesthesia Post Note  Patient: Bryan Walton  Procedure(s) Performed: OPEN RIGHT INGUINAL HERNIA REPAIR WITH MESH (Right Groin)     Patient location during evaluation: PACU Anesthesia Type: General Level of consciousness: sedated Pain management: pain level controlled Vital Signs Assessment: post-procedure vital signs reviewed and stable Respiratory status: spontaneous breathing and respiratory function stable Cardiovascular status: stable Postop Assessment: no apparent nausea or vomiting Anesthetic complications: no   No complications documented.  Last Vitals:  Vitals:   12/02/19 1315 12/02/19 1330  BP: 103/64 118/71  Pulse: 82 84  Resp: 19 17  Temp: 36.6 C   SpO2: 95% 94%    Last Pain:  Vitals:   12/02/19 1315  TempSrc:   PainSc: Asleep                 [name]

## 2019-12-02 NOTE — Discharge Instructions (Signed)
[facility] _______[facility] ° °HERNIA REPAIR: POST OP INSTRUCTIONS ° °Always review your discharge instruction sheet given to you by the facility where your surgery was performed. °IF YOU HAVE DISABILITY OR FAMILY LEAVE FORMS, YOU MUST BRING THEM TO THE OFFICE FOR PROCESSING.   °DO NOT GIVE THEM TO YOUR DOCTOR. ° °1. A  prescription for pain medication may be given to you upon discharge.  Take your pain medication as prescribed, if needed.  If narcotic pain medicine is not needed, then you may take acetaminophen (Tylenol) or ibuprofen (Advil) as needed. °2. Take your usually prescribed medications unless otherwise directed. °If you need a refill on your pain medication, please contact your pharmacy.  They will contact our office to request authorization. Prescriptions will not be filled after 5 pm or on week-ends. °3. You should follow a light diet the first 24 hours after arrival home, such as soup and crackers, etc.  Be sure to include lots of fluids daily.  Resume your normal diet the day after surgery. °4.Most patients will experience some swelling and bruising around the umbilicus or in the groin and scrotum.  Ice packs and reclining will help.  Swelling and bruising can take several days to resolve.  °6. It is common to experience some constipation if taking pain medication after surgery.  Increasing fluid intake and taking a stool softener (such as Colace) will usually help or prevent this problem from occurring.  A mild laxative (Milk of Magnesia or Miralax) should be taken according to package directions if there are no bowel movements after 48 hours. °7. Unless discharge instructions indicate otherwise, you may remove your bandages 24-48 hours after surgery, and you may shower at that time.  You may have steri-strips (small skin tapes) in place directly over the incision.  These strips should be left on the skin for 7-10 days.  If your surgeon used skin glue on the incision, you may shower in  24 hours.  The glue will flake off over the next 2-3 weeks.  Any sutures or staples will be removed at the office during your follow-up visit. °8. ACTIVITIES:  You may resume regular (light) daily activities beginning the next day--such as daily self-care, walking, climbing stairs--gradually increasing activities as tolerated.  You may have sexual intercourse when it is comfortable.  Refrain from any heavy lifting or straining until approved by your doctor. ° °a.You may drive when you are no longer taking prescription pain medication, you can comfortably wear a seatbelt, and you can safely maneuver your car and apply brakes. °b.RETURN TO WORK:   °_____________________________________________ ° °9.You should see your doctor in the office for a follow-up appointment approximately 2-3 weeks after your surgery.  Make sure that you call for this appointment within a day or two after you arrive home to insure a convenient appointment time. °10.OTHER INSTRUCTIONS: _________________________ °   _____________________________________ ° °WHEN TO CALL YOUR DOCTOR: °1. Fever over 101.0 °2. Inability to urinate °3. Nausea and/or vomiting °4. Extreme swelling or bruising °5. Continued bleeding from incision. °6. Increased pain, redness, or drainage from the incision ° °The clinic staff is available to answer your questions during regular business hours.  Please don’t hesitate to call and ask to speak to one of the nurses for clinical concerns.  If you have a medical emergency, go to the nearest emergency room or call 911.  A surgeon from [facility] is always on call at the hospital ° ° °[address], [address], [place], [place]  [address] ? ° [address] [address], [place], [place]   [address] °[phone] ? [phone] ? FAX [phone] °Web site: [email or web address] ° °

## 2019-12-02 NOTE — Interval H&P Note (Signed)
History and Physical Interval Note:  12/02/2019 9:52 AM  Bryan Walton  has presented today for surgery, with the diagnosis of RIGHT INGUINAL HERNIA.  The various methods of treatment have been discussed with the patient and family. After consideration of risks, benefits and other options for treatment, the patient has consented to  Procedure(s): OPEN RIGHT INGUINAL HERNIA REPAIR WITH MESH (Right) as a surgical intervention.  The patient's history has been reviewed, patient examined, no change in status, stable for surgery.  I have reviewed the patient's chart and labs.  Questions were answered to the patient's satisfaction.     Axel Filler

## 2019-12-03 ENCOUNTER — Encounter (HOSPITAL_COMMUNITY): Payer: Self-pay | Admitting: General Surgery

## 2019-12-03 LAB — SURGICAL PATHOLOGY

## 2020-01-07 ENCOUNTER — Other Ambulatory Visit: Payer: Self-pay | Admitting: General Surgery

## 2020-01-07 ENCOUNTER — Other Ambulatory Visit (HOSPITAL_COMMUNITY): Payer: Self-pay | Admitting: General Surgery

## 2020-01-07 DIAGNOSIS — Z9889 Other specified postprocedural states: Secondary | ICD-10-CM

## 2020-01-11 ENCOUNTER — Other Ambulatory Visit: Payer: Self-pay

## 2020-01-11 ENCOUNTER — Ambulatory Visit (HOSPITAL_COMMUNITY)
Admission: RE | Admit: 2020-01-11 | Discharge: 2020-01-11 | Disposition: A | Discharge status: Home / Self Care | Payer: Self-pay | Source: Ambulatory Visit | Attending: General Surgery | Admitting: General Surgery

## 2020-01-11 DIAGNOSIS — Z9889 Other specified postprocedural states: Secondary | ICD-10-CM | POA: Insufficient documentation

## 2022-03-27 IMAGING — CT CT PELVIS W/O CM
2 of 4 series · 16 of 46 positions shown, 18 images · non-contrast
Comparison: None.

CLINICAL DATA: Right-sided inguinal hernia

EXAM:
CT PELVIS WITHOUT CONTRAST
TECHNIQUE: Multidetector CT imaging of the pelvis was performed following the
standard protocol without oral or intravenous contrast.

[Series 602: cor st · coronal · 0.86mm/px · 3 of 137 slices shown]
[im 46/137  soft-tissue]
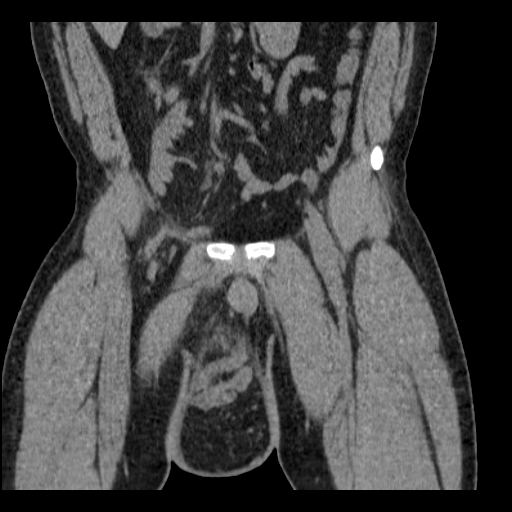
[im 61/137  soft-tissue]
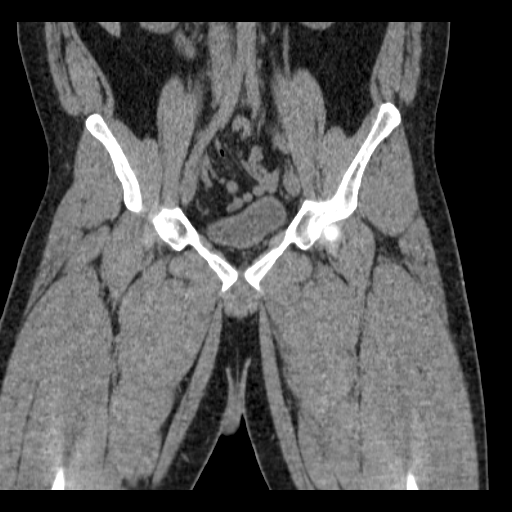
[im 76/137  soft-tissue]
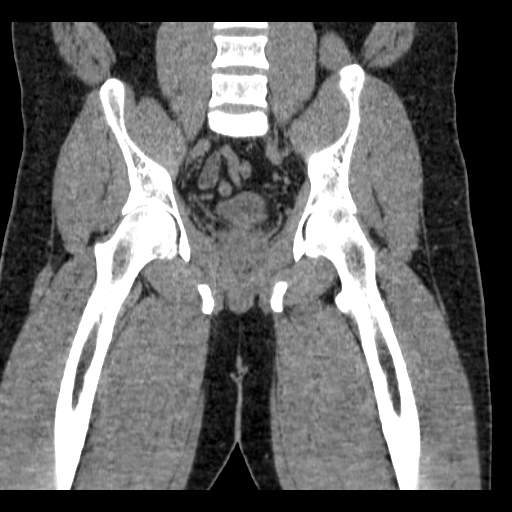

[Series 604: axial st · axial · 0.86mm/px · z∈[-86,+252]mm · 13 of 195 slices shown, 15 images]
[im 13/195  soft-tissue]
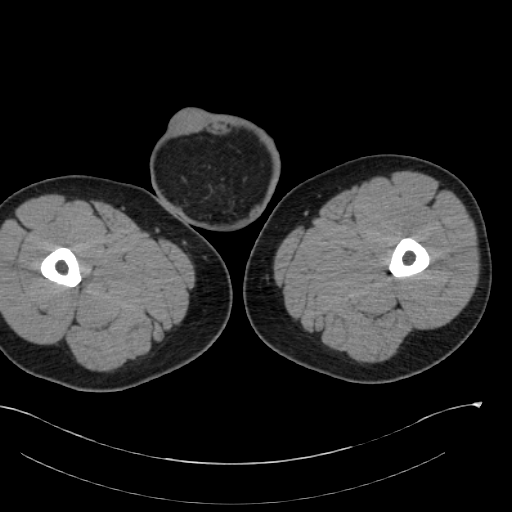
[im 13/195  bone]
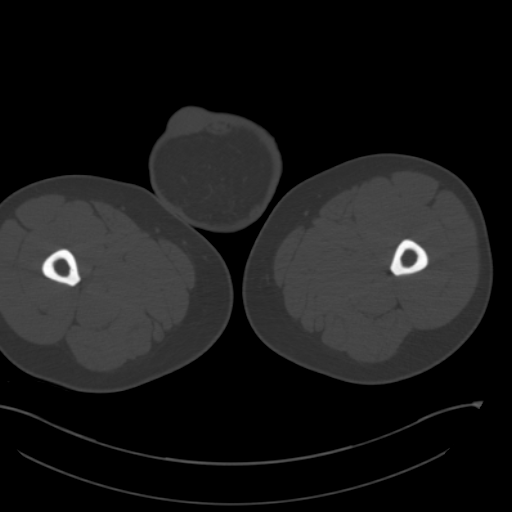
[im 26/195  soft-tissue]
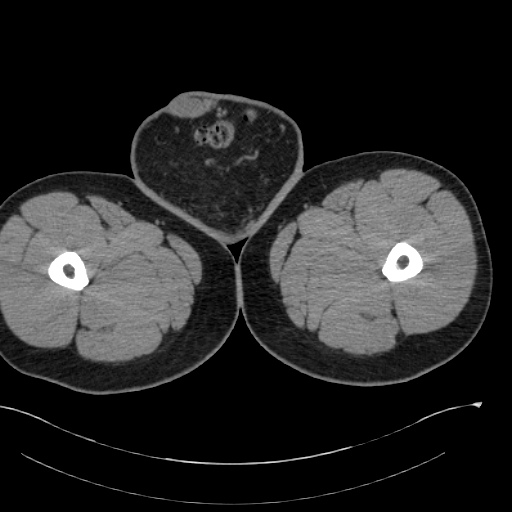
[im 39/195  soft-tissue]
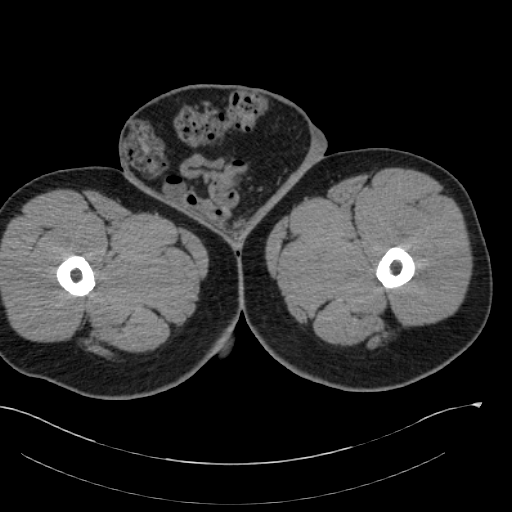
[im 52/195  soft-tissue]
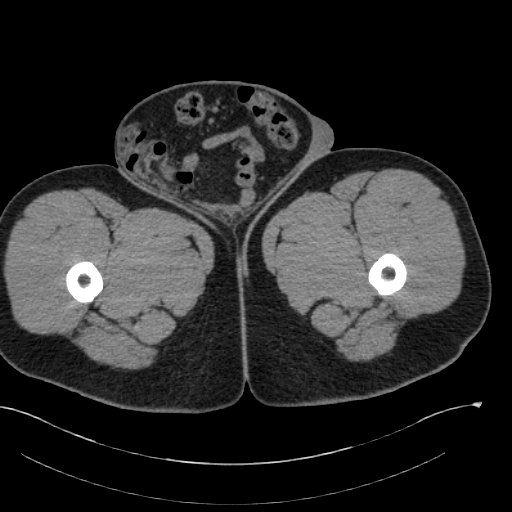
[im 65/195  soft-tissue]
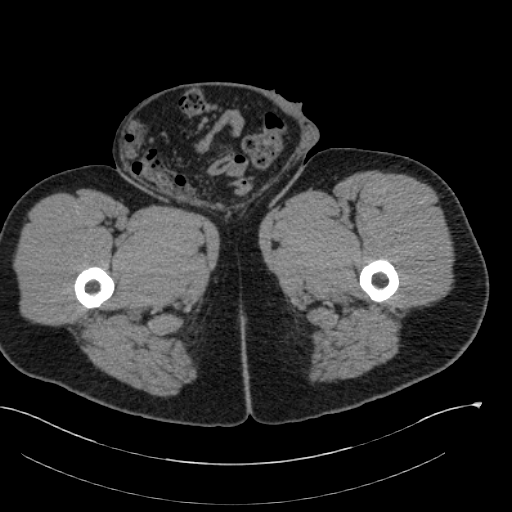
[im 78/195  soft-tissue]
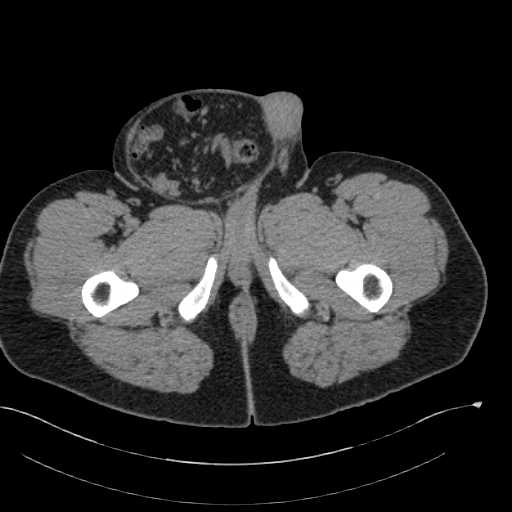
[im 104/195  soft-tissue]
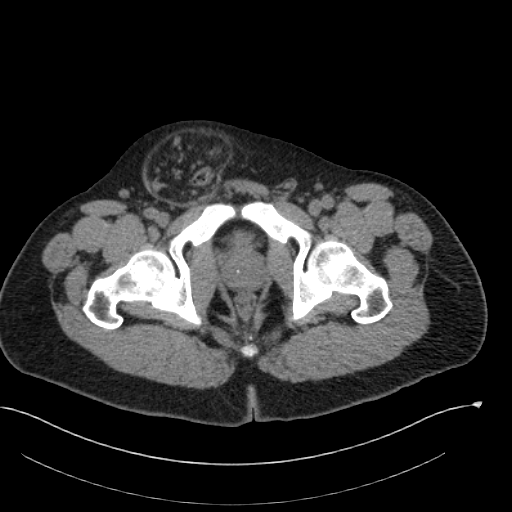
[im 117/195  soft-tissue]
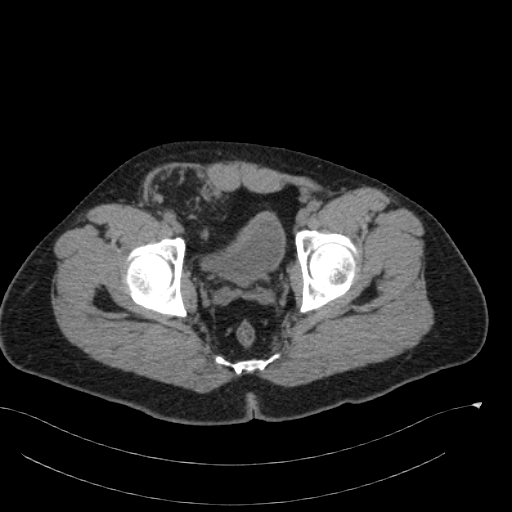
[im 130/195  soft-tissue]
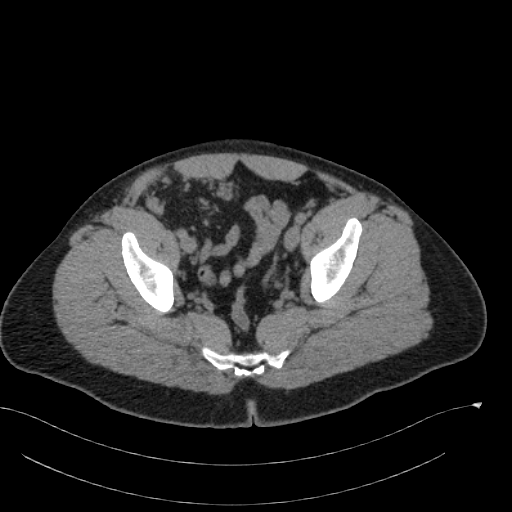
[im 130/195  bone]
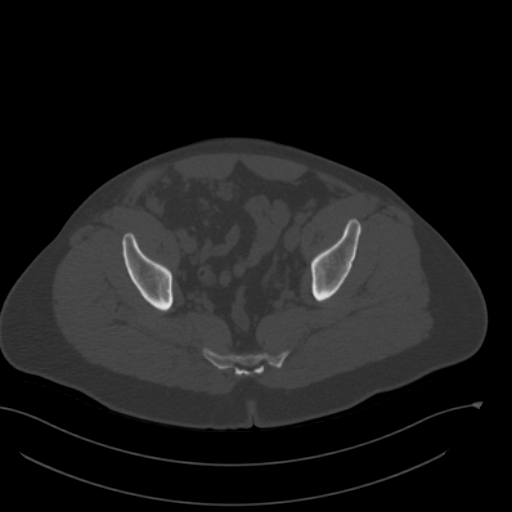
[im 143/195  soft-tissue]
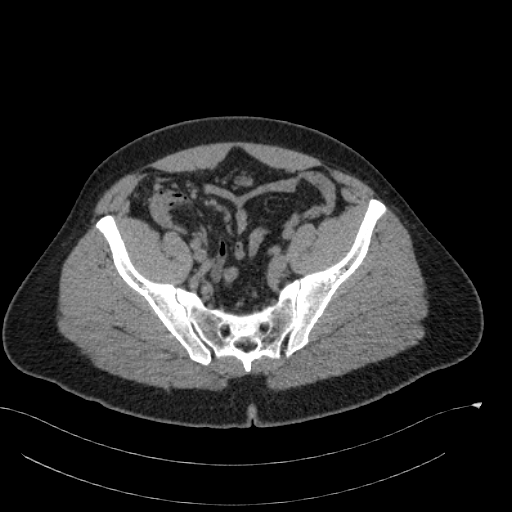
[im 156/195  soft-tissue]
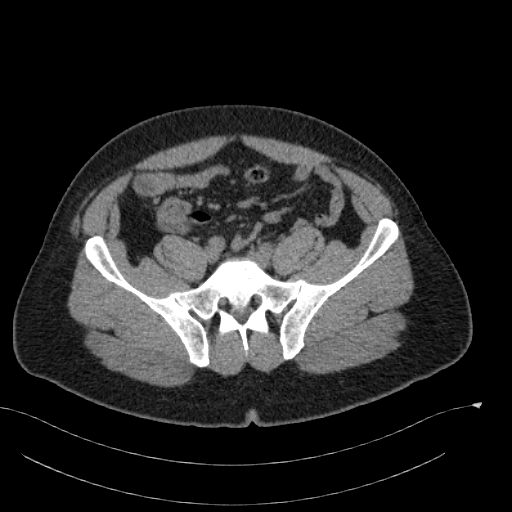
[im 169/195  soft-tissue]
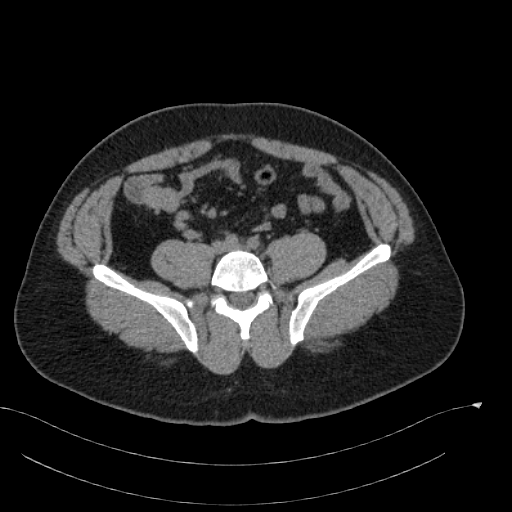
[im 182/195  soft-tissue]
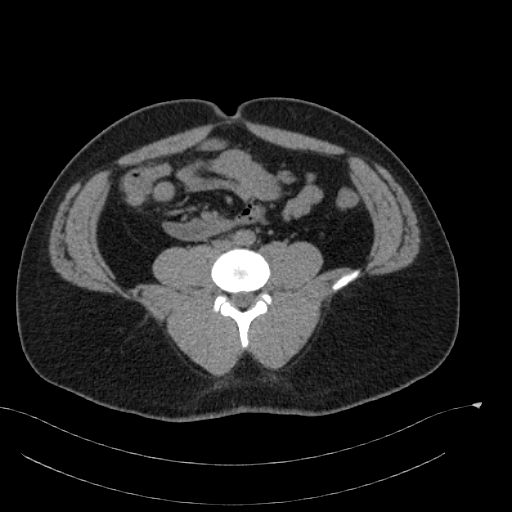

[16 of 46 positions shown; findings below may reference images not displayed]

FINDINGS: Urinary Tract: Urinary bladder is midline with wall thickness within
normal limits. Urinary bladder does not extend into the large right
inguinal hernia. No ureteral lesions evident in the visualized
regions.

Bowel: No bowel wall thickening or bowel obstruction. Loops of small
small and large bowel extend into a large inguinal hernia on the
right without bowel compromise. No free air evident pelvic region.

Vascular/Lymphatic: No vascular lesions are appreciable on this
noncontrast enhanced study.

Reproductive: There are a few tiny prostatic calculi. Prostate and
seminal vesicles are normal in size and contour.

Other: There is a large direct appearing inguinal hernia which
contains extensive fat as well as loops of small and large bowel, no
bowel compromise. This hernia extends into the scrotum on the right.
At the level of the scrotum, the hernia extends to the left of
midline impressing on the left testis. The hernia measures 26.0 cm
from superior to inferior dimension. The hernia measures 16.6 cm
from right to left dimension and 11.0 cm from anterior to posterior
dimension. The hernia at its neck measures 4.9 cm from right to left
dimension and 3.2 cm from superior to inferior dimension.

There is no abscess or ascites in the abdomen or pelvis. Visualized
appendix appears unremarkable.

Musculoskeletal: No blastic or lytic bone lesions. No intramuscular
lesions evident.
IMPRESSION: Direct appearing right inguinal hernia extending inferiorly into the
right scrotum and enlarging the scrotum. Hernia impresses upon the
left testis. Hernia measures 26.0 x 16.6 x 11.0 cm. Hernia contains
fat as well as loops of small and large bowel without bowel
compromise.

Study otherwise essentially unremarkable.

## 2022-07-15 IMAGING — US US PELVIS LIMITED
1 series · 14 of 19 positions shown · non-contrast
Comparison: Prior CT from 09/23/2019.

CLINICAL DATA: Initial evaluation status post right inguinal hernia
repair with mesh 5 weeks ago. Evaluate for seroma.

EXAM:
LIMITED ULTRASOUND OF PELVIS
TECHNIQUE: Limited transabdominal ultrasound examination of the pelvis was
performed.

[Series 1: us pelvis limited · 19 acquisitions, 14 frames shown]
[im 1/19]
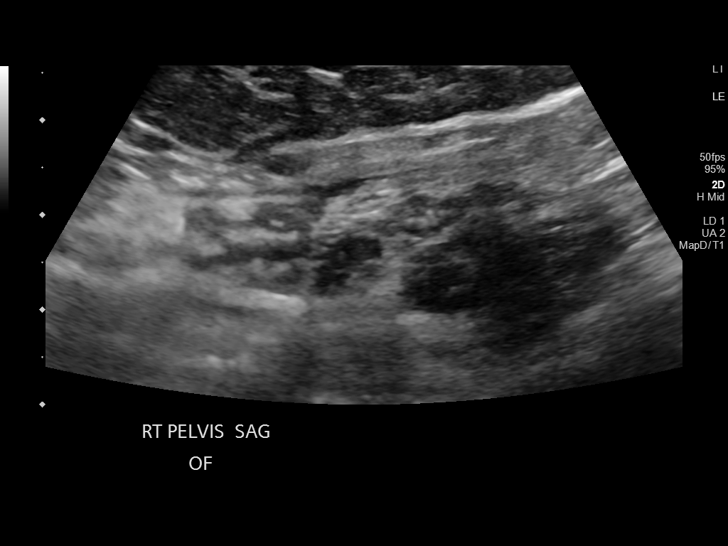
[im 3/19]
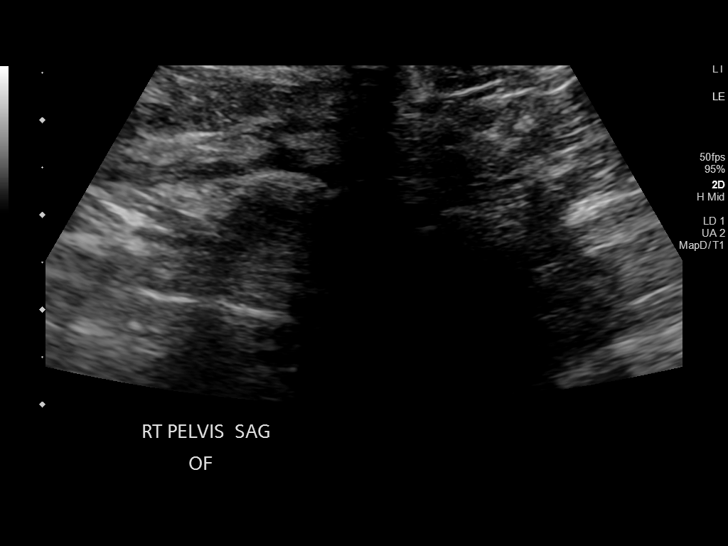
[im 4/19]
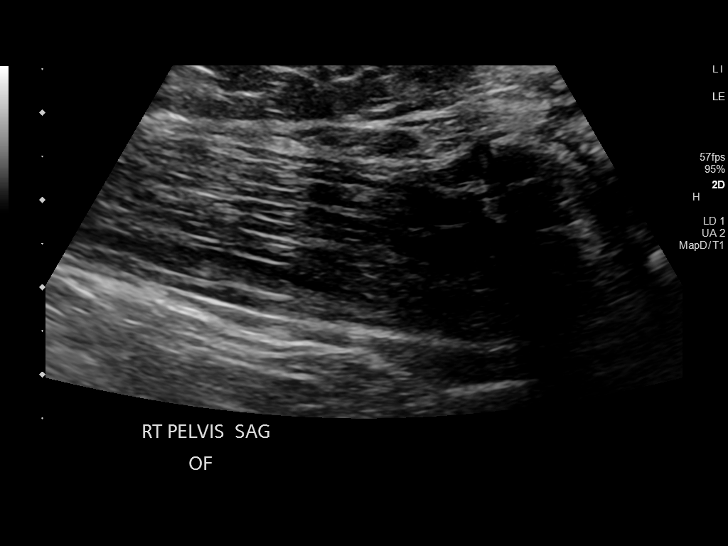
[im 5/19]
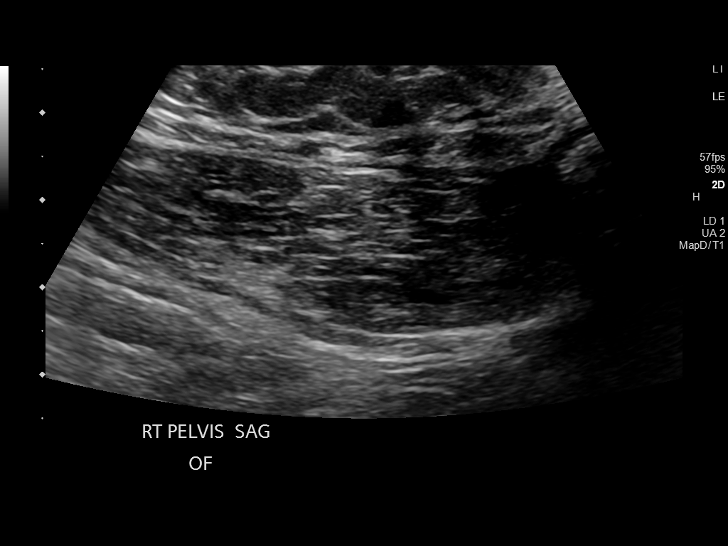
[im 7/19]
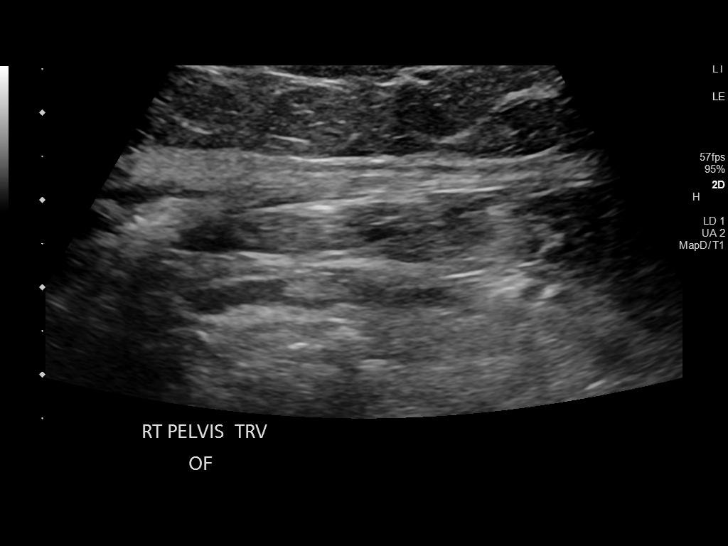
[im 8/19]
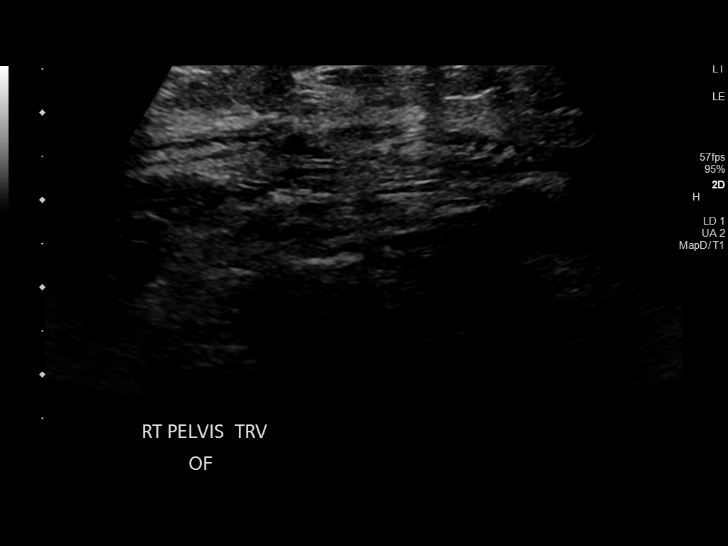
[im 9/19]
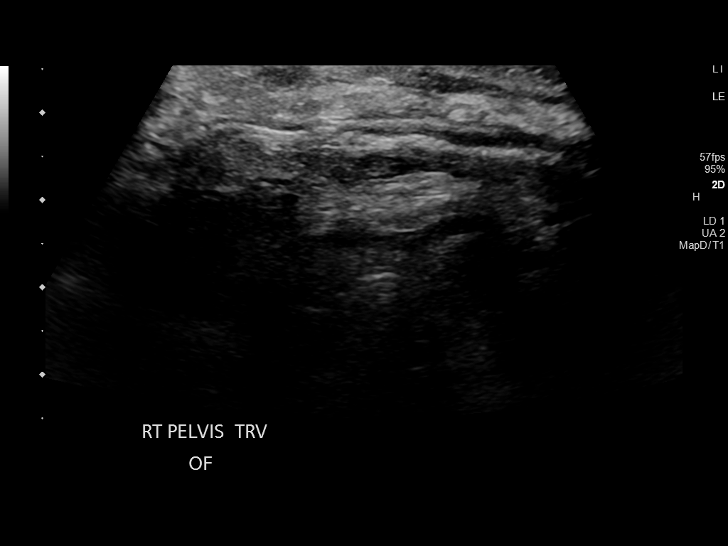
[im 11/19]
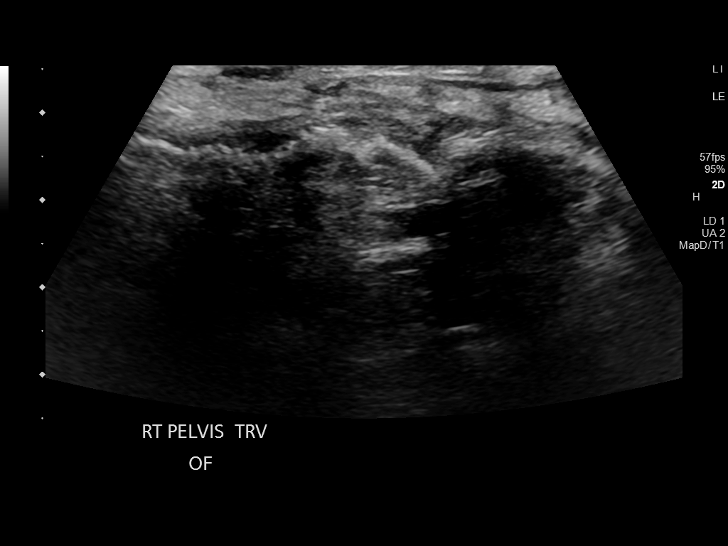
[im 12/19]
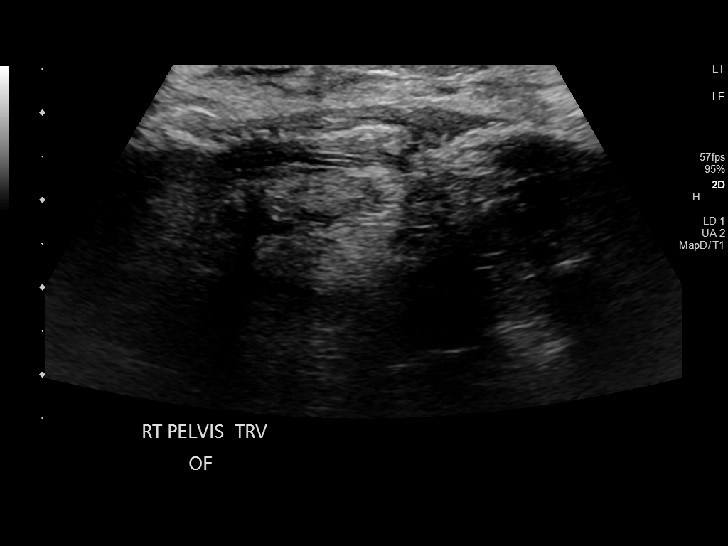
[im 13/19]
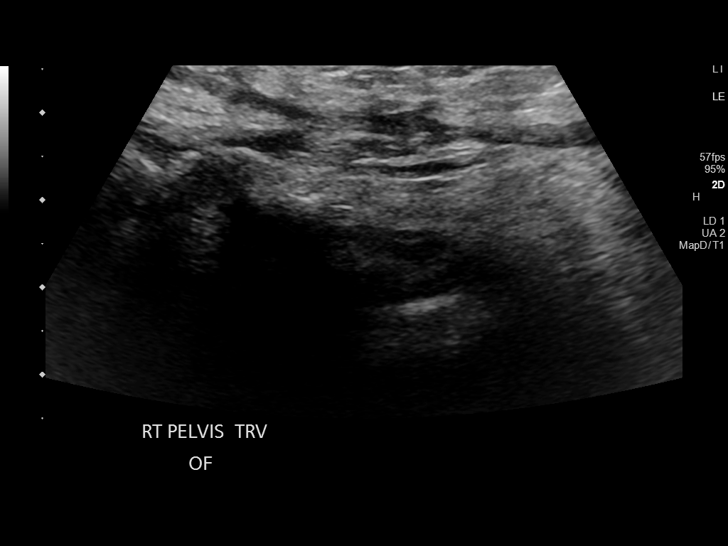
[im 15/19]
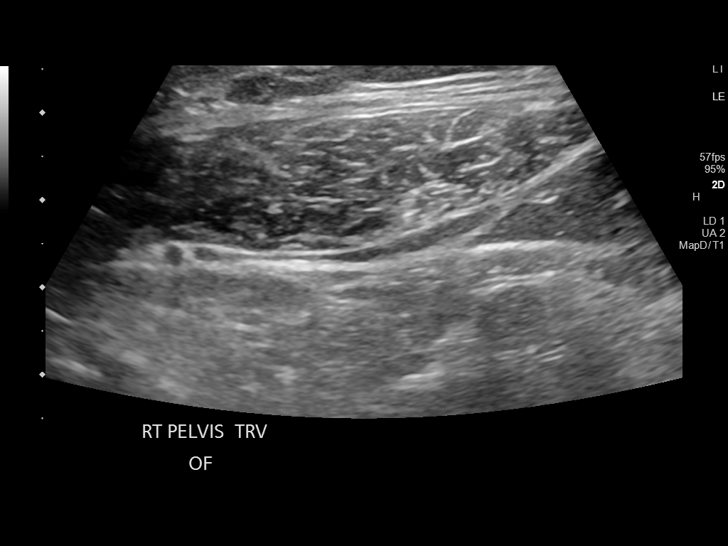
[im 16/19]
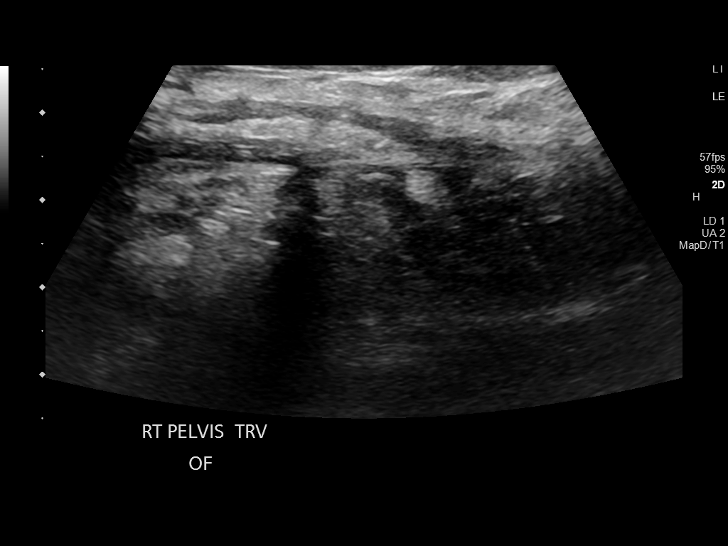
[im 17/19]
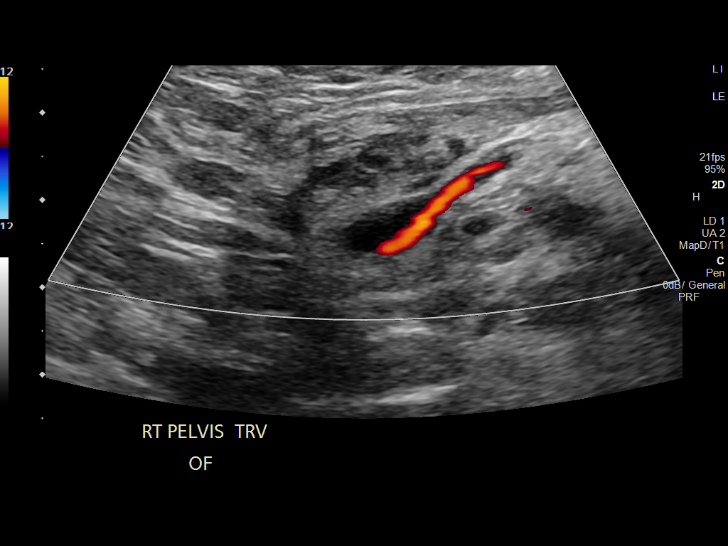
[im 19/19]
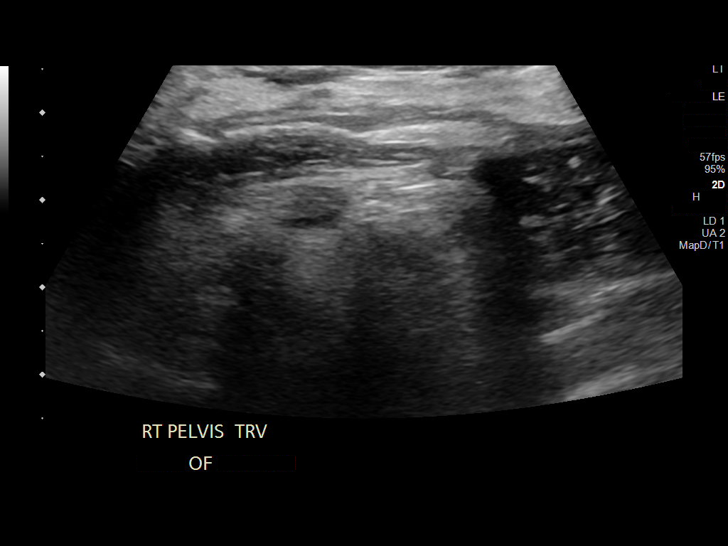

[14 of 19 positions shown; findings below may reference images not displayed]

FINDINGS: Target ultrasound at the surgical site at the right inguinal region
was performed. No loculated collection to suggest seroma or other
abnormality identified. No visible recurrent or residual hernia. No
complication or adverse features.
IMPRESSION: Negative ultrasound of the right inguinal region. No seroma,
residual hernia, or other abnormality identified.
